# Patient Record
Sex: Male | Born: 1980 | Race: Black or African American | Hispanic: No | Marital: Single | Smoking: Current every day smoker
Health system: Southern US, Community
[De-identification: ages and names within clinical notes are randomized; demographics above are authoritative.]

## PROBLEM LIST (undated history)

## (undated) HISTORY — PX: SHOULDER SURGERY: SHX246

---

## 2005-06-02 ENCOUNTER — Emergency Department (HOSPITAL_COMMUNITY): Admission: EM | Admit: 2005-06-02 | Discharge: 2005-06-02 | Payer: Self-pay | Admitting: Emergency Medicine

## 2007-02-08 ENCOUNTER — Emergency Department (HOSPITAL_COMMUNITY): Admission: EM | Admit: 2007-02-08 | Discharge: 2007-02-08 | Payer: Self-pay | Admitting: Emergency Medicine

## 2011-05-18 LAB — RAPID URINE DRUG SCREEN, HOSP PERFORMED: Cocaine: NOT DETECTED

## 2011-05-18 LAB — DIFFERENTIAL
Basophils Absolute: 0
Basophils Relative: 1
Eosinophils Absolute: 0.4
Eosinophils Relative: 8 — ABNORMAL HIGH
Neutrophils Relative %: 41 — ABNORMAL LOW

## 2011-05-18 LAB — I-STAT 8, (EC8 V) (CONVERTED LAB)
Glucose, Bld: 99
HCT: 50
TCO2: 28
pCO2, Ven: 46.6
pH, Ven: 7.362 — ABNORMAL HIGH

## 2011-05-18 LAB — URINE MICROSCOPIC-ADD ON

## 2011-05-18 LAB — URINALYSIS, ROUTINE W REFLEX MICROSCOPIC
Glucose, UA: NEGATIVE
Ketones, ur: NEGATIVE
Leukocytes, UA: NEGATIVE
Specific Gravity, Urine: 1.024
pH: 6

## 2011-05-18 LAB — POCT I-STAT CREATININE
Creatinine, Ser: 1.2
Operator id: 198171

## 2011-05-18 LAB — ACETAMINOPHEN LEVEL: Acetaminophen (Tylenol), Serum: <10 — ABNORMAL LOW

## 2011-05-18 LAB — CBC
HCT: 45.9
MCHC: 33.3
MCV: 93.3
Platelets: 323
RDW: 13
WBC: 5.7

## 2014-03-27 ENCOUNTER — Encounter (HOSPITAL_COMMUNITY): Payer: Self-pay | Admitting: Emergency Medicine

## 2014-03-27 ENCOUNTER — Emergency Department (HOSPITAL_COMMUNITY)
Admission: EM | Admit: 2014-03-27 | Discharge: 2014-03-27 | Disposition: A | Discharge status: Home / Self Care | Payer: Self-pay | Attending: Emergency Medicine | Admitting: Emergency Medicine

## 2014-03-27 DIAGNOSIS — F17200 Nicotine dependence, unspecified, uncomplicated: Secondary | ICD-10-CM | POA: Insufficient documentation

## 2014-03-27 DIAGNOSIS — Z202 Contact with and (suspected) exposure to infections with a predominantly sexual mode of transmission: Secondary | ICD-10-CM | POA: Insufficient documentation

## 2014-03-27 MED ORDER — AZITHROMYCIN 250 MG PO TABS
1000.0000 mg | ORAL_TABLET | Freq: Once | ORAL | Status: AC
  Administered 2014-03-27: 1000 mg via ORAL
  Filled 2014-03-27: qty 4

## 2014-03-27 MED ORDER — CEFTRIAXONE SODIUM 250 MG IJ SOLR
250.0000 mg | Freq: Once | INTRAMUSCULAR | Status: AC
  Administered 2014-03-27: 250 mg via INTRAMUSCULAR
  Filled 2014-03-27: qty 250

## 2014-03-27 MED ORDER — PENICILLIN G BENZATHINE 1200000 UNIT/2ML IM SUSP
2.4000 10*6.[IU] | Freq: Once | INTRAMUSCULAR | Status: AC
  Administered 2014-03-27: 2.4 10*6.[IU] via INTRAMUSCULAR
  Filled 2014-03-27: qty 4
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
## 2014-03-27 NOTE — ED Provider Notes (Signed)
Medical screening examination/treatment/procedure(s) were performed by non-physician practitioner and as supervising physician I was immediately available for consultation/collaboration.   EKG Interpretation None        Purvis Sheffield, MD 03/27/14 [phone]
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
## 2014-03-28 LAB — HIV ANTIBODY (ROUTINE TESTING W REFLEX): HIV 1&2 Ab, 4th Generation: NONREACTIVE

## 2014-03-28 LAB — GC/CHLAMYDIA PROBE AMP
CT Probe RNA: NEGATIVE
GC PROBE AMP APTIMA: NEGATIVE

## 2014-03-28 LAB — RPR

## 2014-04-22 ENCOUNTER — Emergency Department (HOSPITAL_COMMUNITY)
Admission: EM | Admit: 2014-04-22 | Discharge: 2014-04-22 | Disposition: A | Discharge status: Home / Self Care | Payer: Self-pay | Attending: Emergency Medicine | Admitting: Emergency Medicine

## 2014-04-22 ENCOUNTER — Encounter (HOSPITAL_COMMUNITY): Payer: Self-pay | Admitting: Emergency Medicine

## 2014-04-22 ENCOUNTER — Emergency Department (HOSPITAL_COMMUNITY): Payer: Self-pay

## 2014-04-22 DIAGNOSIS — S6990XA Unspecified injury of unspecified wrist, hand and finger(s), initial encounter: Secondary | ICD-10-CM | POA: Insufficient documentation

## 2014-04-22 DIAGNOSIS — S62306A Unspecified fracture of fifth metacarpal bone, right hand, initial encounter for closed fracture: Secondary | ICD-10-CM

## 2014-04-22 DIAGNOSIS — F17200 Nicotine dependence, unspecified, uncomplicated: Secondary | ICD-10-CM | POA: Insufficient documentation

## 2014-04-22 DIAGNOSIS — S62309A Unspecified fracture of unspecified metacarpal bone, initial encounter for closed fracture: Secondary | ICD-10-CM | POA: Insufficient documentation

## 2014-04-22 MED ORDER — HYDROCODONE-ACETAMINOPHEN 5-325 MG PO TABS: 1.0000 | ORAL_TABLET | ORAL | Status: DC | PRN

## 2014-04-22 MED ORDER — HYDROCODONE-ACETAMINOPHEN 5-325 MG PO TABS
2.0000 | ORAL_TABLET | Freq: Once | ORAL | Status: AC
  Administered 2014-04-22: 2 via ORAL
  Filled 2014-04-22: qty 2
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
## 2014-04-22 NOTE — Progress Notes (Signed)
[facility] Community Liaison Stacy, ° °Provided pt with a list of primary care resources to help patient establish primary care.  °

## 2014-04-22 NOTE — ED Notes (Signed)
Ortho called for splint  

## 2014-04-22 NOTE — Discharge Instructions (Signed)
Keep splint intact until Hand surgery follow up. Take vicodin as needed for pain. Rest, ice and elevate your hand.

## 2014-04-22 NOTE — ED Provider Notes (Signed)
Medical screening examination/treatment/procedure(s) were performed by non-physician practitioner and as supervising physician I was immediately available for consultation/collaboration.   EKG Interpretation None       Morrill Bomkamp, MD 04/22/14 1649 

## 2014-05-28 ENCOUNTER — Encounter (HOSPITAL_COMMUNITY): Payer: Self-pay | Admitting: Emergency Medicine

## 2014-05-28 ENCOUNTER — Emergency Department (HOSPITAL_COMMUNITY)
Admission: EM | Admit: 2014-05-28 | Discharge: 2014-05-28 | Disposition: A | Discharge status: Home / Self Care | Payer: Self-pay | Attending: Emergency Medicine | Admitting: Emergency Medicine

## 2014-05-28 DIAGNOSIS — S62316D Displaced fracture of base of fifth metacarpal bone, right hand, subsequent encounter for fracture with routine healing: Secondary | ICD-10-CM | POA: Insufficient documentation

## 2014-05-28 DIAGNOSIS — N6081 Other benign mammary dysplasias of right breast: Secondary | ICD-10-CM | POA: Insufficient documentation

## 2014-05-28 DIAGNOSIS — S62318D Displaced fracture of base of other metacarpal bone, subsequent encounter for fracture with routine healing: Secondary | ICD-10-CM

## 2014-05-28 DIAGNOSIS — Z720 Tobacco use: Secondary | ICD-10-CM | POA: Insufficient documentation

## 2014-05-28 DIAGNOSIS — X58XXXD Exposure to other specified factors, subsequent encounter: Secondary | ICD-10-CM | POA: Insufficient documentation

## 2014-05-28 MED ORDER — TRAMADOL HCL 50 MG PO TABS: 50.0000 mg | ORAL_TABLET | Freq: Four times a day (QID) | ORAL | Status: DC | PRN

## 2014-05-28 MED ORDER — HYDROCODONE-ACETAMINOPHEN 5-325 MG PO TABS: 1.0000 | ORAL_TABLET | Freq: Four times a day (QID) | ORAL | Status: AC | PRN

## 2014-05-28 MED ORDER — HYDROCODONE-ACETAMINOPHEN 5-325 MG PO TABS: 2.0000 | ORAL_TABLET | ORAL | Status: DC | PRN

## 2014-05-28 NOTE — Discharge Instructions (Signed)
Epidermal Cyst An epidermal cyst is sometimes called a sebaceous cyst, epidermal inclusion cyst, or infundibular cyst. These cysts usually contain a substance that looks "pasty" or "cheesy" and may have a bad smell. This substance is a protein called keratin. Epidermal cysts are usually found on the face, neck, or trunk. They may also occur in the vaginal area or other parts of the genitalia of both men and women. Epidermal cysts are usually small, painless, slow-growing bumps or lumps that move freely under the skin. It is important not to try to pop them. This may cause an infection and lead to tenderness and swelling. CAUSES  Epidermal cysts may be caused by a deep penetrating injury to the skin or a plugged hair follicle, often associated with acne. SYMPTOMS  Epidermal cysts can become inflamed and cause:  Redness.  Tenderness.  Increased temperature of the skin over the bumps or lumps.  Grayish-white, bad smelling material that drains from the bump or lump. DIAGNOSIS  Epidermal cysts are easily diagnosed by your caregiver during an exam. Rarely, a tissue sample (biopsy) may be taken to rule out other conditions that may resemble epidermal cysts. TREATMENT   Epidermal cysts often get better and disappear on their own. They are rarely ever cancerous.  If a cyst becomes infected, it may become inflamed and tender. This may require opening and draining the cyst. Treatment with antibiotics may be necessary. When the infection is gone, the cyst may be removed with minor surgery.  Small, inflamed cysts can often be treated with antibiotics or by injecting steroid medicines.  Sometimes, epidermal cysts become large and bothersome. If this happens, surgical removal in your caregiver's office may be necessary. HOME CARE INSTRUCTIONS  Only take over-the-counter or prescription medicines as directed by your caregiver.  Take your antibiotics as directed. Finish them even if you start to feel  better. SEEK MEDICAL CARE IF:   Your cyst becomes tender, red, or swollen.  Your condition is not improving or is getting worse.  You have any other questions or concerns. MAKE SURE YOU:  Understand these instructions.  Will watch your condition.  Will get help right away if you are not doing well or get worse. Document Released: 06/19/2004 Document Revised: 10/11/2011 Document Reviewed: 01/25/2011 Exit[employer] Patient Information 2015 [employer]. This information is not intended to replace advice given to you by your health care provider. Make sure you discuss any questions you have with your health care provider.  

## 2014-05-28 NOTE — ED Notes (Signed)
[facility]. No other questions/concerns. Knows not to drink/drive/operate heavy machinery with prescribed medications. Acknowledges understanding.
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
